# Patient Record
Sex: Female | Born: 1955 | Race: White | Hispanic: No | Smoking: Current every day smoker
Health system: Southern US, Community
[De-identification: ages and names within clinical notes are randomized; demographics above are authoritative.]

---

## 1998-01-29 ENCOUNTER — Ambulatory Visit (HOSPITAL_COMMUNITY): Admission: RE | Admit: 1998-01-29 | Discharge: 1998-01-29 | Payer: Self-pay | Admitting: Obstetrics and Gynecology

## 1998-05-20 ENCOUNTER — Other Ambulatory Visit: Admission: RE | Admit: 1998-05-20 | Discharge: 1998-05-20 | Payer: Self-pay | Admitting: Obstetrics and Gynecology

## 1998-08-19 ENCOUNTER — Other Ambulatory Visit: Admission: RE | Admit: 1998-08-19 | Discharge: 1998-08-19 | Payer: Self-pay | Admitting: Obstetrics and Gynecology

## 1998-11-11 ENCOUNTER — Encounter: Payer: Self-pay | Admitting: Obstetrics and Gynecology

## 1998-11-11 ENCOUNTER — Ambulatory Visit (HOSPITAL_COMMUNITY): Admission: RE | Admit: 1998-11-11 | Discharge: 1998-11-11 | Payer: Self-pay | Admitting: Obstetrics and Gynecology

## 1998-11-16 ENCOUNTER — Other Ambulatory Visit: Admission: RE | Admit: 1998-11-16 | Discharge: 1998-11-16 | Payer: Self-pay | Admitting: Obstetrics and Gynecology

## 2004-08-11 ENCOUNTER — Other Ambulatory Visit: Admission: RE | Admit: 2004-08-11 | Discharge: 2004-08-11 | Payer: Self-pay | Admitting: *Deleted

## 2008-07-30 ENCOUNTER — Other Ambulatory Visit: Admission: RE | Admit: 2008-07-30 | Discharge: 2008-07-30 | Payer: Self-pay | Admitting: Family Medicine

## 2010-12-19 ENCOUNTER — Encounter: Payer: Self-pay | Admitting: Gastroenterology

## 2012-02-19 ENCOUNTER — Ambulatory Visit (INDEPENDENT_AMBULATORY_CARE_PROVIDER_SITE_OTHER): Payer: BC Managed Care – PPO | Admitting: Obstetrics and Gynecology

## 2012-02-19 DIAGNOSIS — N898 Other specified noninflammatory disorders of vagina: Secondary | ICD-10-CM

## 2012-02-19 DIAGNOSIS — Z01419 Encounter for gynecological examination (general) (routine) without abnormal findings: Secondary | ICD-10-CM

## 2012-02-19 DIAGNOSIS — R319 Hematuria, unspecified: Secondary | ICD-10-CM

## 2012-03-18 ENCOUNTER — Encounter: Payer: Self-pay | Admitting: Obstetrics and Gynecology

## 2012-03-18 ENCOUNTER — Ambulatory Visit (INDEPENDENT_AMBULATORY_CARE_PROVIDER_SITE_OTHER): Payer: BC Managed Care – PPO | Admitting: Obstetrics and Gynecology

## 2012-03-18 DIAGNOSIS — N898 Other specified noninflammatory disorders of vagina: Secondary | ICD-10-CM

## 2012-03-18 DIAGNOSIS — F419 Anxiety disorder, unspecified: Secondary | ICD-10-CM

## 2012-03-18 DIAGNOSIS — F411 Generalized anxiety disorder: Secondary | ICD-10-CM

## 2012-03-18 DIAGNOSIS — N951 Menopausal and female climacteric states: Secondary | ICD-10-CM | POA: Insufficient documentation

## 2012-03-18 DIAGNOSIS — R319 Hematuria, unspecified: Secondary | ICD-10-CM

## 2012-03-18 DIAGNOSIS — N390 Urinary tract infection, site not specified: Secondary | ICD-10-CM | POA: Insufficient documentation

## 2012-03-18 LAB — POCT URINALYSIS DIPSTICK
Bilirubin, UA: NEGATIVE
Blood, UA: NEGATIVE
Glucose, UA: NEGATIVE
Ketones, UA: NEGATIVE
Leukocytes, UA: NEGATIVE
Nitrite, UA: NEGATIVE
Protein, UA: NEGATIVE
Spec Grav, UA: 1.025
Urobilinogen, UA: NEGATIVE
pH, UA: 5

## 2012-03-18 NOTE — Progress Notes (Addendum)
Pt reports that she is better. Pos urine culture treated with Septra. Vag disch resolved with Flagyl. Anxiety/vertigo is better(?). Hx of hematuria.  UA: neg  Plan: RTO prn or for annual.  AVS

## 2016-02-19 ENCOUNTER — Ambulatory Visit (INDEPENDENT_AMBULATORY_CARE_PROVIDER_SITE_OTHER): Payer: BLUE CROSS/BLUE SHIELD | Admitting: Family Medicine

## 2016-02-19 VITALS — BP 126/84 | HR 99 | Temp 99.5°F | Resp 18 | Ht 61.0 in | Wt 119.2 lb

## 2016-02-19 DIAGNOSIS — R319 Hematuria, unspecified: Secondary | ICD-10-CM | POA: Diagnosis not present

## 2016-02-19 DIAGNOSIS — F329 Major depressive disorder, single episode, unspecified: Secondary | ICD-10-CM

## 2016-02-19 DIAGNOSIS — R300 Dysuria: Secondary | ICD-10-CM | POA: Diagnosis not present

## 2016-02-19 DIAGNOSIS — J0100 Acute maxillary sinusitis, unspecified: Secondary | ICD-10-CM

## 2016-02-19 DIAGNOSIS — R51 Headache: Secondary | ICD-10-CM

## 2016-02-19 DIAGNOSIS — F32A Depression, unspecified: Secondary | ICD-10-CM

## 2016-02-19 DIAGNOSIS — R519 Headache, unspecified: Secondary | ICD-10-CM

## 2016-02-19 LAB — POCT URINALYSIS DIP (MANUAL ENTRY)
Bilirubin, UA: NEGATIVE
Glucose, UA: NEGATIVE
Leukocytes, UA: NEGATIVE
Nitrite, UA: NEGATIVE
Protein Ur, POC: NEGATIVE
Spec Grav, UA: <=1.005
Urobilinogen, UA: 0.2
pH, UA: 6

## 2016-02-19 LAB — POC MICROSCOPIC URINALYSIS (UMFC): Mucus: ABSENT

## 2016-02-19 MED ORDER — BUPROPION HCL ER (XL) 150 MG PO TB24: 150.0000 mg | ORAL_TABLET | Freq: Every day | ORAL | Status: DC

## 2016-02-19 MED ORDER — CIPROFLOXACIN HCL 500 MG PO TABS: 500.0000 mg | ORAL_TABLET | Freq: Two times a day (BID) | ORAL | Status: DC

## 2016-02-19 NOTE — Patient Instructions (Addendum)
Sinusitis, Adult Sinusitis is redness, soreness, and inflammation of the paranasal sinuses. Paranasal sinuses are air pockets within the bones of your face. They are located beneath your eyes, in the middle of your forehead, and above your eyes. In healthy paranasal sinuses, mucus is able to drain out, and air is able to circulate through them by way of your nose. However, when your paranasal sinuses are inflamed, mucus and air can become trapped. This can allow bacteria and other germs to grow and cause infection. Sinusitis can develop quickly and last only a short time (acute) or continue over a long period (chronic). Sinusitis that lasts for more than 12 weeks is considered chronic. CAUSES Causes of sinusitis include:  Allergies.  Structural abnormalities, such as displacement of the cartilage that separates your nostrils (deviated septum), which can decrease the air flow through your nose and sinuses and affect sinus drainage.  Functional abnormalities, such as when the small hairs (cilia) that line your sinuses and help remove mucus do not work properly or are not present. SIGNS AND SYMPTOMS Symptoms of acute and chronic sinusitis are the same. The primary symptoms are pain and pressure around the affected sinuses. Other symptoms include:  Upper toothache.  Earache.  Headache.  Bad breath.  Decreased sense of smell and taste.  A cough, which worsens when you are lying flat.  Fatigue.  Fever.  Thick drainage from your nose, which often is green and may contain pus (purulent).  Swelling and warmth over the affected sinuses. DIAGNOSIS Your health care provider will perform a physical exam. During your exam, your health care provider may perform any of the following to help determine if you have acute sinusitis or chronic sinusitis:  Look in your nose for signs of abnormal growths in your nostrils (nasal polyps).  Tap over the affected sinus to check for signs of  infection.  View the inside of your sinuses using an imaging device that has a light attached (endoscope). If your health care provider suspects that you have chronic sinusitis, one or more of the following tests may be recommended:  Allergy tests.  Nasal culture. A sample of mucus is taken from your nose, sent to a lab, and screened for bacteria.  Nasal cytology. A sample of mucus is taken from your nose and examined by your health care provider to determine if your sinusitis is related to an allergy. TREATMENT Most cases of acute sinusitis are related to a viral infection and will resolve on their own within 10 days. Sometimes, medicines are prescribed to help relieve symptoms of both acute and chronic sinusitis. These may include pain medicines, decongestants, nasal steroid sprays, or saline sprays. However, for sinusitis related to a bacterial infection, your health care provider will prescribe antibiotic medicines. These are medicines that will help kill the bacteria causing the infection. Rarely, sinusitis is caused by a fungal infection. In these cases, your health care provider will prescribe antifungal medicine. For some cases of chronic sinusitis, surgery is needed. Generally, these are cases in which sinusitis recurs more than 3 times per year, despite other treatments. HOME CARE INSTRUCTIONS  Drink plenty of water. Water helps thin the mucus so your sinuses can drain more easily.  Use a humidifier.  Inhale steam 3-4 times a day (for example, sit in the bathroom with the shower running).  Apply a warm, moist washcloth to your face 3-4 times a day, or as directed by your health care provider.  Use saline nasal sprays to help   moisten and clean your sinuses.  Take medicines only as directed by your health care provider.  If you were prescribed either an antibiotic or antifungal medicine, finish it all even if you start to feel better. SEEK IMMEDIATE MEDICAL CARE IF:  You have  increasing pain or severe headaches.  You have nausea, vomiting, or drowsiness.  You have swelling around your face.  You have vision problems.  You have a stiff neck.  You have difficulty breathing.   This information is not intended to replace advice given to you by your health care provider. Make sure you discuss any questions you have with your health care provider.   Document Released: 11/13/2005 Document Revised: 12/04/2014 Document Reviewed: 11/28/2011 Elsevier Interactive Patient Education 2016 Elsevier Inc.  Urinary Tract Infection Urinary tract infections (UTIs) can develop anywhere along your urinary tract. Your urinary tract is your body's drainage system for removing wastes and extra water. Your urinary tract includes two kidneys, two ureters, a bladder, and a urethra. Your kidneys are a pair of bean-shaped organs. Each kidney is about the size of your fist. They are located below your ribs, one on each side of your spine. CAUSES Infections are caused by microbes, which are microscopic organisms, including fungi, viruses, and bacteria. These organisms are so small that they can only be seen through a microscope. Bacteria are the microbes that most commonly cause UTIs. SYMPTOMS  Symptoms of UTIs may vary by age and gender of the patient and by the location of the infection. Symptoms in young women typically include a frequent and intense urge to urinate and a painful, burning feeling in the bladder or urethra during urination. Older women and men are more likely to be tired, shaky, and weak and have muscle aches and abdominal pain. A fever may mean the infection is in your kidneys. Other symptoms of a kidney infection include pain in your back or sides below the ribs, nausea, and vomiting. DIAGNOSIS To diagnose a UTI, your caregiver will ask you about your symptoms. Your caregiver will also ask you to provide a urine sample. The urine sample will be tested for bacteria and white  blood cells. White blood cells are made by your body to help fight infection. TREATMENT  Typically, UTIs can be treated with medication. Because most UTIs are caused by a bacterial infection, they usually can be treated with the use of antibiotics. The choice of antibiotic and length of treatment depend on your symptoms and the type of bacteria causing your infection. HOME CARE INSTRUCTIONS  If you were prescribed antibiotics, take them exactly as your caregiver instructs you. Finish the medication even if you feel better after you have only taken some of the medication.  Drink enough water and fluids to keep your urine clear or pale yellow.  Avoid caffeine, tea, and carbonated beverages. They tend to irritate your bladder.  Empty your bladder often. Avoid holding urine for long periods of time.  Empty your bladder before and after sexual intercourse.  After a bowel movement, women should cleanse from front to back. Use each tissue only once. SEEK MEDICAL CARE IF:   You have back pain.  You develop a fever.  Your symptoms do not begin to resolve within 3 days. SEEK IMMEDIATE MEDICAL CARE IF:   You have severe back pain or lower abdominal pain.  You develop chills.  You have nausea or vomiting.  You have continued burning or discomfort with urination. MAKE SURE YOU:   Understand these  instructions.  Will watch your condition.  Will get help right away if you are not doing well or get worse.   This information is not intended to replace advice given to you by your health care provider. Make sure you discuss any questions you have with your health care provider.   Document Released: 08/23/2005 Document Revised: 08/04/2015 Document Reviewed: 12/22/2011 Elsevier Interactive Patient Education [employer]2016 Elsevier Inc.

## 2016-02-19 NOTE — Progress Notes (Signed)
Subjective:    Patient ID: Kari CorningSheila W Savage, female    DOB: 03/13/1956, 60 y.o.   MRN: [id number] By signing my name below, I, Soijett Blue, attest that this documentation has been prepared under the direction and in the presence of Elvina SidleKurt Yaxiel Minnie, MD. Electronically Signed: Soijett Blue, ED Scribe. 02/19/2016. 4:10 PM.  Chief Complaint  Patient presents with  . Headache    for the past few days  . Depression    Due to situation at home, scored a 6 on screening     HPI   Kari Savage is a 60 y.o. female who presents today complaining of constant, moderate, HA onset 2 days. Pt is having associated symptoms of photophobia. She notes that she has not tried any medications for the relief of her symptoms. She denies any other symptoms.   Pt secondarily complains of malodorous urine x 1 week and dysuria. Pt has not tried any medications for the relief of her symptoms. Pt denies any other symptoms.     Pt works at [employer].   History reviewed. No pertinent past medical history.  Allergies  Allergen Reactions  . Penicillins Rash   Current Outpatient Prescriptions on File Prior to Visit  Medication Sig Dispense Refill  . Multiple Vitamin (MULTIVITAMIN) capsule Take 1 capsule by mouth daily.     No current facility-administered medications on file prior to visit.   Patient has been experiencing "dark thoughts" for the last several weeks. She's been on Wellbutrin in the past and it has worked well for her. She would like a refill   Review of Systems  Eyes: Positive for photophobia.  Genitourinary: Positive for dysuria.       Malodorous urine  Neurological: Positive for headaches.  All other systems reviewed and are negative.      Objective:   Physical Exam  Constitutional: She is oriented to person, place, and time. She appears well-developed and well-nourished. No distress.  HENT:  Head: Normocephalic and atraumatic.  Right Ear: Tympanic  membrane, external ear and ear canal normal.  Left Ear: Tympanic membrane, external ear and ear canal normal.  Mouth/Throat: Uvula is midline, oropharynx is clear and moist and mucous membranes are normal.  Mild swelling of nasal passages.   Eyes: EOM are normal.  Neck: Neck supple.  Cardiovascular: Normal rate, regular rhythm and normal heart sounds.   No murmur heard. Pulmonary/Chest: Effort normal and breath sounds normal. No respiratory distress. She has no wheezes. She has no rales.  Abdominal: There is no CVA tenderness.  Musculoskeletal: Normal range of motion.  Neurological: She is alert and oriented to person, place, and time.  Skin: Skin is warm and dry.  Psychiatric: She has a normal mood and affect. Her behavior is normal.  Nursing note and vitals reviewed.        BP 126/84 mmHg  Pulse 99  Temp(Src) 99.5 F (37.5 C) (Oral)  Resp 18  Ht 5\' 1"  (1.549 m)  Wt 119 lb 3.2 oz (54.069 kg)  BMI 22.53 kg/m2  SpO2 97%  Assessment & Plan:   This chart was scribed in my presence and reviewed by me personally.    ICD-9-CM ICD-10-CM   1. Dysuria 788.1 R30.0 POCT urinalysis dipstick     POCT Microscopic Urinalysis ([facility])     Urine culture  2. Sinus headache 784.0 R51   3. Acute maxillary sinusitis, recurrence not specified 461.0 J01.00   4. Hematuria 599.70 R31.9 ciprofloxacin (CIPRO) 500 MG  tablet     Urine culture  5. Depression 311 F32.9 buPROPion (WELLBUTRIN XL) 150 MG 24 hr tablet     Signed, Elvina Sidle, MD

## 2016-02-21 LAB — URINE CULTURE
Colony Count: NO GROWTH
Organism ID, Bacteria: NO GROWTH

## 2016-08-14 ENCOUNTER — Other Ambulatory Visit: Payer: Self-pay | Admitting: Family Medicine

## 2016-08-14 DIAGNOSIS — F329 Major depressive disorder, single episode, unspecified: Secondary | ICD-10-CM

## 2016-08-14 DIAGNOSIS — F32A Depression, unspecified: Secondary | ICD-10-CM

## 2016-09-12 ENCOUNTER — Other Ambulatory Visit: Payer: Self-pay | Admitting: Family Medicine

## 2016-09-12 DIAGNOSIS — F329 Major depressive disorder, single episode, unspecified: Secondary | ICD-10-CM

## 2016-09-12 DIAGNOSIS — F32A Depression, unspecified: Secondary | ICD-10-CM

## 2016-09-28 ENCOUNTER — Ambulatory Visit (INDEPENDENT_AMBULATORY_CARE_PROVIDER_SITE_OTHER): Payer: BLUE CROSS/BLUE SHIELD | Admitting: Family Medicine

## 2016-09-28 VITALS — BP 140/82 | HR 97 | Temp 98.7°F | Resp 17 | Ht 61.0 in | Wt 127.0 lb

## 2016-09-28 DIAGNOSIS — F419 Anxiety disorder, unspecified: Secondary | ICD-10-CM

## 2016-09-28 MED ORDER — BUPROPION HCL ER (XL) 150 MG PO TB24: ORAL_TABLET | ORAL | 3 refills | Status: DC

## 2016-09-28 NOTE — Patient Instructions (Addendum)
Return for follow-up in 6 months.  Your prescription has been refilled and submitted to your pharmacy.  Nice meeting you !  Godfrey PickKimberly S. Tiburcio PeaHarris, MSN, FNP-C [facility]   IF you received an x-ray today, you will receive an invoice from [facility]. Please contact [facility] at [phone] with questions or concerns regarding your invoice.   IF you received labwork today, you will receive an invoice from [facility]. Please contact [facility] at [phone] with questions or concerns regarding your invoice.   Our billing staff will not be able to assist you with questions regarding bills from these companies.  You will be contacted with the lab results as soon as they are available. The fastest way to get your results is to activate your My Chart account. Instructions are located on the last page of this paperwork. If you have not heard from us regarding the results in 2 weeks, please contact this office.   Smoking Cessation, Tips for Success If you are ready to quit smoking, congratulations! You have chosen to help yourself be healthier. Cigarettes bring nicotine, tar, carbon monoxide, and other irritants into your body. Your lungs, heart, and blood vessels will be able to work better without these poisons. There are many different ways to quit smoking. Nicotine gum, nicotine patches, a nicotine inhaler, or nicotine nasal spray can help with physical craving. Hypnosis, support groups, and medicines help break the habit of smoking. WHAT THINGS CAN I DO TO MAKE QUITTING EASIER?  Here are some tips to help you quit for good:  Pick a date when you will quit smoking completely. Tell all of your friends and family about your plan to quit on that date.  Do not try to slowly cut down on the number of cigarettes you are smoking. Pick a quit date and quit smoking completely starting on that day.  Throw away all  cigarettes.   Clean and remove all ashtrays from your home, work, and car.  On a card, write down your reasons for quitting. Carry the card with you and read it when you get the urge to smoke.  Cleanse your body of nicotine. Drink enough water and fluids to keep your urine clear or pale yellow. Do this after quitting to flush the nicotine from your body.  Learn to predict your moods. Do not let a bad situation be your excuse to have a cigarette. Some situations in your life might tempt you into wanting a cigarette.  Never have "just one" cigarette. It leads to wanting another and another. Remind yourself of your decision to quit.  Change habits associated with smoking. If you smoked while driving or when feeling stressed, try other activities to replace smoking. Stand up when drinking your coffee. Brush your teeth after eating. Sit in a different chair when you read the paper. Avoid alcohol while trying to quit, and try to drink fewer caffeinated beverages. Alcohol and caffeine may urge you to smoke.  Avoid foods and drinks that can trigger a desire to smoke, such as sugary or spicy foods and alcohol.  Ask people who smoke not to smoke around you.  Have something planned to do right after eating or having a cup of coffee. For example, plan to take a walk or exercise.  Try a relaxation exercise to calm you down and decrease your stress. Remember, you may be tense and nervous for the first 2 weeks after you quit, but this  will pass.  Find new activities to keep your hands busy. Play with a pen, coin, or rubber band. Doodle or draw things on paper.  Brush your teeth right after eating. This will help cut down on the craving for the taste of tobacco after meals. You can also try mouthwash.   Use oral substitutes in place of cigarettes. Try using lemon drops, carrots, cinnamon sticks, or chewing gum. Keep them handy so they are available when you have the urge to smoke.  When you have the  urge to smoke, try deep breathing.  Designate your home as a nonsmoking area.  If you are a heavy smoker, ask your health care provider about a prescription for nicotine chewing gum. It can ease your withdrawal from nicotine.  Reward yourself. Set aside the cigarette money you save and buy yourself something nice.  Look for support from others. Join a support group or smoking cessation program. Ask someone at home or at work to help you with your plan to quit smoking.  Always ask yourself, "Do I need this cigarette or is this just a reflex?" Tell yourself, "Today, I choose not to smoke," or "I do not want to smoke." You are reminding yourself of your decision to quit.  Do not replace cigarette smoking with electronic cigarettes (commonly called e-cigarettes). The safety of e-cigarettes is unknown, and some may contain harmful chemicals.  If you relapse, do not give up! Plan ahead and think about what you will do the next time you get the urge to smoke. HOW WILL I FEEL WHEN I QUIT SMOKING? You may have symptoms of withdrawal because your body is used to nicotine (the addictive substance in cigarettes). You may crave cigarettes, be irritable, feel very hungry, cough often, get headaches, or have difficulty concentrating. The withdrawal symptoms are only temporary. They are strongest when you first quit but will go away within 10-14 days. When withdrawal symptoms occur, stay in control. Think about your reasons for quitting. Remind yourself that these are signs that your body is healing and getting used to being without cigarettes. Remember that withdrawal symptoms are easier to treat than the major diseases that smoking can cause.  Even after the withdrawal is over, expect periodic urges to smoke. However, these cravings are generally short lived and will go away whether you smoke or not. Do not smoke! WHAT RESOURCES ARE AVAILABLE TO HELP ME QUIT SMOKING? Your health care provider can direct you to  community resources or hospitals for support, which may include:  Group support.  Education.  Hypnosis.  Therapy.   This information is not intended to replace advice given to you by your health care provider. Make sure you discuss any questions you have with your health care provider.   Document Released: 08/11/2004 Document Revised: 12/04/2014 Document Reviewed: 05/01/2013 Elsevier Interactive Patient Education [employer]2016 Elsevier Inc.

## 2016-09-28 NOTE — Progress Notes (Signed)
Subjective:     Kari CorningSheila W Savage is a 60 y.o. female who presents for follow up of anxiety disorder. Currently asymptomatic  She denies current suicidal and homicidal ideation. Denies any side effects from the treatment. Reports that panic attacks have completely resolved since she's started the medication and she has significantly reduced the amount of cigarettes she smokes daily.Denies any unintentional weight loss.    Objective:    BP 140/82 (BP Location: Left Arm, Patient Position: Sitting, Cuff Size: Normal)   Pulse 97   Temp 98.7 F (37.1 C) (Oral)   Resp 17   Ht 5\' 1"  (1.549 m)   Wt 127 lb (57.6 kg)   SpO2 98%   BMI 24.00 kg/m   General:  alert, cooperative and appears stated age  Affect/Behavior:  full facial expressions, good grooming, good insight, normal perception and normal reasoning very plesant and cheerful disposition.  Normal heart rate and rhythm.  Breath sounds within normal limits.      Assessment:    Anxiety Disorder - improving, panic attacks have completely resolved since taking Wellbutrin.     Plan:    Medications: Continue Wellbutrin 150 mg XR.   Return for follow-up in 6 months.  Kari PickKimberly S. Tiburcio PeaHarris, MSN, FNP-C [facility]  Total of 15 minutes  spent with pt., greater than 50% which was spent in discussion of treatment effectiveness and symptom management of anxiety.

## 2016-09-29 ENCOUNTER — Ambulatory Visit: Payer: BLUE CROSS/BLUE SHIELD

## 2017-11-28 ENCOUNTER — Other Ambulatory Visit: Payer: Self-pay

## 2017-11-28 ENCOUNTER — Ambulatory Visit: Payer: BLUE CROSS/BLUE SHIELD | Admitting: Physician Assistant

## 2017-11-28 ENCOUNTER — Encounter: Payer: Self-pay | Admitting: Physician Assistant

## 2017-11-28 VITALS — BP 157/91 | HR 85 | Temp 98.6°F | Resp 16 | Ht 61.0 in | Wt 132.8 lb

## 2017-11-28 DIAGNOSIS — Z8659 Personal history of other mental and behavioral disorders: Secondary | ICD-10-CM

## 2017-11-28 MED ORDER — BUPROPION HCL ER (XL) 150 MG PO TB24: ORAL_TABLET | ORAL | 3 refills | Status: AC

## 2017-11-28 NOTE — Progress Notes (Signed)
   Kari CorningSheila W Savage  MRN: [id number] DOB: 08/29/1956  PCP: Kirkland HunStringer, Arthur, MD  Subjective:  Pt is a 62 year old female who presents to clinic for medication refill of Wellbutrin XL 150mg  qd. Last OV for this 09/2016 seen by Joaquin CourtsKimberly Harris. Wellbutrin started by Dr. Milus GlazierLauenstein 01/2016 for panic attacks.  Had attacks daily -has not had a panic attack since she started this medication. Attacks are brought on by home stressors. She is living with her ex husband who does not have a job and she is paying all the bills. He has a job [employer] tomorrow. She had a suicide attempt on 08/12/2015 - tried to hang herself. Her roommate came in and saved her.  She was in in-patient behavioral health facility for one week. Denies SI or HI since that time.   Several care gaps including PAP, colonoscopy, MM. She has had bad experiences with medical providers in the past and is hesitant to make annual exam appt.   Review of Systems  Gastrointestinal: Negative for abdominal pain, nausea and vomiting.  Psychiatric/Behavioral: Negative for self-injury and suicidal ideas. The patient is nervous/anxious.     Patient Active Problem List   Diagnosis Date Noted  . UTI (lower urinary tract infection) 03/18/2012  . Vaginal discharge 03/18/2012  . Symptomatic menopausal or female climacteric states 03/18/2012  . Anxiety 03/18/2012    Current Outpatient Medications on File Prior to Visit  Medication Sig Dispense Refill  . buPROPion (WELLBUTRIN XL) 150 MG 24 hr tablet TAKE 1 TABLET(150 MG) BY MOUTH DAILY 90 tablet 3  . Multiple Vitamin (MULTIVITAMIN) capsule Take 1 capsule by mouth daily.     No current facility-administered medications on file prior to visit.     Allergies  Allergen Reactions  . Penicillins Rash     Objective:  BP (!) 157/91   Pulse 85   Temp 98.6 F (37 C) (Oral)   Resp 16   Ht 5\' 1"  (1.549 m)   Wt 132 lb 12.8 oz (60.2 kg)   SpO2 97%   BMI 25.09 kg/m   Physical Exam    Constitutional: She is oriented to person, place, and time and well-developed, well-nourished, and in no distress. No distress.  Cardiovascular: Normal rate, regular rhythm and normal heart sounds.  Neurological: She is alert and oriented to person, place, and time. GCS score is 15.  Skin: Skin is warm and dry.  Psychiatric: Mood, memory, affect and judgment normal.  Vitals reviewed.   Assessment and Plan :  1. History of panic attacks - buPROPion (WELLBUTRIN XL) 150 MG 24 hr tablet; TAKE 1 TABLET(150 MG) BY MOUTH DAILY  Dispense: 90 tablet; Refill: 3 - Wellbutrin x 2 years. Panic attacks well controlled with 150mg  qd. H/o suicide attempt 3 years ago. She denies SI or HI. Denies medication side effects. OK to refill. Encouraged pt to RTC for annual exam. MM phone number provided.   Marco CollieWhitney Euline Kimbler, PA-C  [facility] 11/28/2017 12:20 PM

## 2017-11-28 NOTE — Patient Instructions (Addendum)
Make an appointment for your annual exam. You are due for a PAP and colonoscopy.   You need to schedule you mammogram.  Please call the breast center at [facility] at [phone] to schedule   Thank you for coming in today. I hope you feel we met your needs.  Feel free to call PCP if you have any questions or further requests.  Please consider signing up for MyChart if you do not already have it, as this is a great way to communicate with me.  Best,  Whitney McVey, PA-C   IF you received an x-ray today, you will receive an invoice from [facility]. Please contact [facility] at [phone] with questions or concerns regarding your invoice.   IF you received labwork today, you will receive an invoice from [facility]. Please contact [facility] at [phone] with questions or concerns regarding your invoice.   Our billing staff will not be able to assist you with questions regarding bills from these companies.  You will be contacted with the lab results as soon as they are available. The fastest way to get your results is to activate your My Chart account. Instructions are located on the last page of this paperwork. If you have not heard from [place] regarding the results in 2 weeks, please contact this office.

## 2018-02-04 ENCOUNTER — Other Ambulatory Visit: Payer: Self-pay

## 2018-02-04 ENCOUNTER — Ambulatory Visit (INDEPENDENT_AMBULATORY_CARE_PROVIDER_SITE_OTHER): Payer: BLUE CROSS/BLUE SHIELD | Admitting: Physician Assistant

## 2018-02-04 ENCOUNTER — Encounter: Payer: Self-pay | Admitting: Physician Assistant

## 2018-02-04 VITALS — BP 130/76 | HR 98 | Temp 98.3°F | Resp 16 | Ht 60.0 in | Wt 129.6 lb

## 2018-02-04 DIAGNOSIS — H6123 Impacted cerumen, bilateral: Secondary | ICD-10-CM | POA: Diagnosis not present

## 2018-02-04 DIAGNOSIS — H938X2 Other specified disorders of left ear: Secondary | ICD-10-CM

## 2018-02-04 NOTE — Patient Instructions (Signed)
     IF you received an x-ray today, you will receive an invoice from [facility]. Please contact [facility] at [phone] with questions or concerns regarding your invoice.   IF you received labwork today, you will receive an invoice from [facility]. Please contact [facility] at [phone] with questions or concerns regarding your invoice.   Our billing staff will not be able to assist you with questions regarding bills from these companies.  You will be contacted with the lab results as soon as they are available. The fastest way to get your results is to activate your My Chart account. Instructions are located on the last page of this paperwork. If you have not heard from us regarding the results in 2 weeks, please contact this office.     

## 2018-02-04 NOTE — Progress Notes (Signed)
   Kari CorningSheila W Savage  MRN: [id number] DOB: 04/16/1956  PCP: Nonnie DoneSlatosky, John J., MD  Subjective:  Pt is a 62 year old female who presents to clinic for left ear pain. Ear has been bothersome for about one month and has worsened over the past few days. Endorses decreased hearing on left. Left-sided HA. She has used a hot washcloth and placed it on her left ear. This did not help much.  She has had this problem in the past. Has had to get her ears cleaned out before. Last ear lavage was several years ago.   Review of Systems  Constitutional: Negative for chills, diaphoresis, fatigue and fever.  HENT: Positive for ear pain and hearing loss. Negative for congestion, ear discharge, postnasal drip, rhinorrhea, sinus pressure, sinus pain, sore throat and tinnitus.   Respiratory: Negative for cough.   Neurological: Positive for headaches. Negative for dizziness.  Psychiatric/Behavioral: Negative for sleep disturbance.    Patient Active Problem List   Diagnosis Date Noted  . UTI (lower urinary tract infection) 03/18/2012  . Vaginal discharge 03/18/2012  . Symptomatic menopausal or female climacteric states 03/18/2012  . Anxiety 03/18/2012    Current Outpatient Medications on File Prior to Visit  Medication Sig Dispense Refill  . buPROPion (WELLBUTRIN XL) 150 MG 24 hr tablet TAKE 1 TABLET(150 MG) BY MOUTH DAILY 90 tablet 3  . Multiple Vitamin (MULTIVITAMIN) capsule Take 1 capsule by mouth daily.     No current facility-administered medications on file prior to visit.     Allergies  Allergen Reactions  . Penicillins Rash     Objective:  BP 130/76   Pulse 98   Temp 98.3 F (36.8 C) (Oral)   Resp 16   Ht 5' (1.524 m)   Wt 129 lb 9.6 oz (58.8 kg)   SpO2 96%   BMI 25.31 kg/m   Physical Exam  Constitutional: She is oriented to person, place, and time and well-developed, well-nourished, and in no distress. No distress.  HENT:  Right Ear: Tympanic membrane normal.  Left Ear: Tympanic  membrane normal.  Nose: Mucosal edema present. No rhinorrhea. Right sinus exhibits no maxillary sinus tenderness and no frontal sinus tenderness. Left sinus exhibits no maxillary sinus tenderness and no frontal sinus tenderness.  Mouth/Throat: Oropharynx is clear and moist and mucous membranes are normal.  B/l cerumen impaction.   Neurological: She is alert and oriented to person, place, and time. GCS score is 15.  Skin: Skin is warm and dry.  Psychiatric: Mood, memory, affect and judgment normal.  Vitals reviewed.   Assessment and Plan :  1. Bilateral impacted cerumen 2. Pressure sensation in left ear - Ear wax removal - Ambulatory referral to ENT - We had a very difficulty time with today's ear lavage due to the anatomy of pt's ear canals. I feel she would benefit from eval and treatment by ENT. Will refer. She understands and agrees.    Marco CollieWhitney Riven Beebe, PA-C  [facility] 02/04/2018 2:40 PM

## 2018-02-27 ENCOUNTER — Other Ambulatory Visit: Payer: Self-pay

## 2018-02-27 ENCOUNTER — Ambulatory Visit: Payer: BLUE CROSS/BLUE SHIELD | Admitting: Emergency Medicine

## 2018-02-27 ENCOUNTER — Ambulatory Visit (INDEPENDENT_AMBULATORY_CARE_PROVIDER_SITE_OTHER): Payer: BLUE CROSS/BLUE SHIELD

## 2018-02-27 ENCOUNTER — Encounter: Payer: Self-pay | Admitting: Emergency Medicine

## 2018-02-27 VITALS — BP 170/76 | HR 100 | Temp 98.7°F | Resp 16 | Ht 61.0 in | Wt 131.8 lb

## 2018-02-27 DIAGNOSIS — S63601A Unspecified sprain of right thumb, initial encounter: Secondary | ICD-10-CM | POA: Diagnosis not present

## 2018-02-27 DIAGNOSIS — S6991XA Unspecified injury of right wrist, hand and finger(s), initial encounter: Secondary | ICD-10-CM | POA: Diagnosis not present

## 2018-02-27 NOTE — Patient Instructions (Addendum)
IF you received an x-ray today, you will receive an invoice from [facility]. Please contact [facility] at [phone] with questions or concerns regarding your invoice.   IF you received labwork today, you will receive an invoice from [facility]. Please contact [facility] at [phone] with questions or concerns regarding your invoice.   Our billing staff will not be able to assist you with questions regarding bills from these companies.  You will be contacted with the lab results as soon as they are available. The fastest way to get your results is to activate your My Chart account. Instructions are located on the last page of this paperwork. If you have not heard from [place] regarding the results in 2 weeks, please contact this office.    Thumb Sprain A thumb sprain is an injury to one of the strong bands of tissue (ligaments) that connect the bones in your thumb. The ligament can be stretched too much or it can tear. A tear can be either partial or complete. The severity of the sprain depends on how much of the ligament was damaged or torn. What are the causes? A thumb sprain is often caused by a fall or an accident. If you extend your hands to catch an object or to protect yourself, the force of the impact can cause your ligament to stretch too much. This excess tension can also cause your ligament to tear. What increases the risk? This injury is more likely to occur in people who play:  Sports that involve a greater risk of falling, such as skiing.  Sports that involve catching an object, such as basketball.  What are the signs or symptoms? Symptoms of this condition include:  Loss of motion in your thumb.  Bruising.  Tenderness.  Swelling.  How is this diagnosed? This condition is diagnosed with a medical history and physical exam. You may also have an X-ray of your thumb. How is this treated? Treatment varies depending on the severity of your sprain. If  your ligament is overstretched or partially torn, treatment usually involves keeping your thumb in a fixed position (immobilization) for a period of time. To help you do this, your health care provider will apply a bandage, cast, or splint to keep your thumb from moving until it heals. If your ligament is fully torn, you may need surgery to reconnect the ligament to the bone. After surgery, a cast or splint will be applied and will need to stay on your thumb while it heals. Your health care provider may also suggest exercises or physical therapy to strengthen your thumb. Follow these instructions at home: If you have a cast:  Do not stick anything inside the cast to scratch your skin. Doing that increases your risk of infection.  Check the skin around the cast every day. Report any concerns to your health care provider. You may put lotion on dry skin around the edges of the cast. Do not apply lotion to the skin underneath the cast.  Keep the cast clean and dry. If you have a splint:  Wear it as directed by your health care provider. Remove it only as directed by your health care provider.  Loosen the splint if your fingers become numb and tingle, or if they turn cold and blue.  Keep the splint clean and dry. Bathing  Cover the bandage, cast, or splint with a watertight plastic bag to protect it from water while you take a bath or a shower. Do  not let the bandage, cast, or splint get wet. Managing pain, stiffness, and swelling  If directed, apply ice to the injured area (unless you have a cast): ? Put ice in a plastic bag. ? Place a towel between your skin and the bag. ? Leave the ice on for 20 minutes, 2-3 times per day.  Move your fingers often to avoid stiffness and to lessen swelling.  Raise (elevate) the injured area above the level of your heart while you are sitting or lying down. Driving  Do not drive or operate heavy machinery while taking pain medicine.  Do not drive while  wearing a cast or splint on a hand that you use for driving. General instructions  Do not put pressure on any part of your cast or splint until it is fully hardened. This may take several hours.  Take medicines only as directed by your health care provider. These include over-the-counter medicines and prescription medicines.  Keep all follow-up visits as directed by your health care provider. This is important.  Do any exercise or physical therapy as directed by your health care provider.  Do not wear rings on your injured thumb. Contact a health care provider if:  Your pain is not controlled with medicine.  Your bruising or swelling gets worse.  Your cast or splint is damaged. Get help right away if:  Your thumb is numb or blue.  Your thumb feels colder than normal. This information is not intended to replace advice given to you by your health care provider. Make sure you discuss any questions you have with your health care provider. Document Released: 12/21/2004 Document Revised: 07/16/2016 Document Reviewed: 08/25/2014 Elsevier Interactive Patient Education  [employer]2018 Elsevier Inc.

## 2018-02-27 NOTE — Progress Notes (Signed)
Kari Savage 62 y.o.   Chief Complaint  Patient presents with  . Hand Injury    RIGHT thumb x 2 day with swelling    HISTORY OF PRESENT ILLNESS: This is a 62 y.o. female complaining of right flank pain from an injury sustained 2 days ago.  Fell off the bed and hit thumb on the floor.  HPI   Prior to Admission medications   Medication Sig Start Date End Date Taking? Authorizing Provider  buPROPion (WELLBUTRIN XL) 150 MG 24 hr tablet TAKE 1 TABLET(150 MG) BY MOUTH DAILY 11/28/17  Yes McVey, Madelaine Bhat, PA-C  Multiple Vitamin (MULTIVITAMIN) capsule Take 1 capsule by mouth daily.   Yes [provider]    Allergies  Allergen Reactions  . Penicillins Rash    Patient Active Problem List   Diagnosis Date Noted  . UTI (lower urinary tract infection) 03/18/2012  . Vaginal discharge 03/18/2012  . Symptomatic menopausal or female climacteric states 03/18/2012  . Anxiety 03/18/2012    No past medical history on file.  No past surgical history on file.  Social History   Socioeconomic History  . Marital status: Divorced    Spouse name: Not on file  . Number of children: Not on file  . Years of education: Not on file  . Highest education level: Not on file  Occupational History  . Not on file  Social Needs  . Financial resource strain: Not on file  . Food insecurity:    Worry: Not on file    Inability: Not on file  . Transportation needs:    Medical: Not on file    Non-medical: Not on file  Tobacco Use  . Smoking status: Current Every Day Smoker    Packs/day: 1.00    Types: Cigarettes  . Smokeless tobacco: Never Used  Substance and Sexual Activity  . Alcohol use: No  . Drug use: No  . Sexual activity: Not on file  Lifestyle  . Physical activity:    Days per week: Not on file    Minutes per session: Not on file  . Stress: Not on file  Relationships  . Social connections:    Talks on phone: Not on file    Gets together: Not on file    Attends  religious service: Not on file    Active member of club or organization: Not on file    Attends meetings of clubs or organizations: Not on file    Relationship status: Not on file  . Intimate partner violence:    Fear of current or ex partner: Not on file    Emotionally abused: Not on file    Physically abused: Not on file    Forced sexual activity: Not on file  Other Topics Concern  . Not on file  Social History Narrative  . Not on file    No family history on file.   Review of Systems  Constitutional: Negative for chills and fever.  Respiratory: Negative for shortness of breath.   Cardiovascular: Negative for chest pain and palpitations.  Gastrointestinal: Negative for nausea and vomiting.  Skin: Negative for rash.  Neurological: Negative for dizziness and headaches.  Endo/Heme/Allergies: Negative.   All other systems reviewed and are negative.  Vitals:   02/27/18 1342  BP: (!) 170/76  Pulse: 100  Resp: 16  Temp: 98.7 F (37.1 C)  SpO2: 97%     Physical Exam  Constitutional: She is oriented to person, place, and time. She appears  well-developed and well-nourished.  HENT:  Head: Normocephalic.  Eyes: Pupils are equal, round, and reactive to light.  Neck: Normal range of motion. Neck supple.  Cardiovascular: Normal rate and regular rhythm.  Pulmonary/Chest: Effort normal and breath sounds normal.  Musculoskeletal:  Right thumb: Positive swelling with some tenderness.  Limited range of motion due to pain.  NVI. Right wrist: Within normal limits with full range of motion.  Neurological: She is alert and oriented to person, place, and time.  Skin: Skin is warm and dry. Capillary refill takes less than 2 seconds.  Psychiatric: She has a normal mood and affect. Her behavior is normal.  Vitals reviewed.  Dg Finger Thumb Right  Result Date: 02/27/2018 CLINICAL DATA:  Right thumb pain due to an unspecified injury. Initial encounter. EXAM: RIGHT THUMB 2+V COMPARISON:   None. FINDINGS: No acute bony or joint abnormality is identified. Mild degenerative change about the IP joint of the thumb is noted. Soft tissues are unremarkable. IMPRESSION: No acute or focal abnormality. Mild osteoarthritis IP joint right thumb. Electronically Signed   By: Kari Savage M.D.   On: 02/27/2018 14:34     ASSESSMENT & PLAN: [name] was seen today for hand injury.  Diagnoses and all orders for this visit:  Injury of right thumb, initial encounter -     DG Finger Thumb Right; Future  Sprain of right thumb, unspecified site of finger, initial encounter   Patient Instructions       IF you received an x-ray today, you will receive an invoice from [facility]. Please contact [facility] at [phone] with questions or concerns regarding your invoice.   IF you received labwork today, you will receive an invoice from [facility]. Please contact [facility] at [phone] with questions or concerns regarding your invoice.   Our billing staff will not be able to assist you with questions regarding bills from these companies.  You will be contacted with the lab results as soon as they are available. The fastest way to get your results is to activate your My Chart account. Instructions are located on the last page of this paperwork. If you have not heard from [place] regarding the results in 2 weeks, please contact this office.    Thumb Sprain A thumb sprain is an injury to one of the strong bands of tissue (ligaments) that connect the bones in your thumb. The ligament can be stretched too much or it can tear. A tear can be either partial or complete. The severity of the sprain depends on how much of the ligament was damaged or torn. What are the causes? A thumb sprain is often caused by a fall or an accident. If you extend your hands to catch an object or to protect yourself, the force of the impact can cause your ligament to stretch too much. This excess tension can  also cause your ligament to tear. What increases the risk? This injury is more likely to occur in people who play:  Sports that involve a greater risk of falling, such as skiing.  Sports that involve catching an object, such as basketball.  What are the signs or symptoms? Symptoms of this condition include:  Loss of motion in your thumb.  Bruising.  Tenderness.  Swelling.  How is this diagnosed? This condition is diagnosed with a medical history and physical exam. You may also have an X-ray of your thumb. How is this treated? Treatment varies depending on the severity of your sprain. If your ligament  is overstretched or partially torn, treatment usually involves keeping your thumb in a fixed position (immobilization) for a period of time. To help you do this, your health care provider will apply a bandage, cast, or splint to keep your thumb from moving until it heals. If your ligament is fully torn, you may need surgery to reconnect the ligament to the bone. After surgery, a cast or splint will be applied and will need to stay on your thumb while it heals. Your health care provider may also suggest exercises or physical therapy to strengthen your thumb. Follow these instructions at home: If you have a cast:  Do not stick anything inside the cast to scratch your skin. Doing that increases your risk of infection.  Check the skin around the cast every day. Report any concerns to your health care provider. You may put lotion on dry skin around the edges of the cast. Do not apply lotion to the skin underneath the cast.  Keep the cast clean and dry. If you have a splint:  Wear it as directed by your health care provider. Remove it only as directed by your health care provider.  Loosen the splint if your fingers become numb and tingle, or if they turn cold and blue.  Keep the splint clean and dry. Bathing  Cover the bandage, cast, or splint with a watertight plastic bag to protect  it from water while you take a bath or a shower. Do not let the bandage, cast, or splint get wet. Managing pain, stiffness, and swelling  If directed, apply ice to the injured area (unless you have a cast): ? Put ice in a plastic bag. ? Place a towel between your skin and the bag. ? Leave the ice on for 20 minutes, 2-3 times per day.  Move your fingers often to avoid stiffness and to lessen swelling.  Raise (elevate) the injured area above the level of your heart while you are sitting or lying down. Driving  Do not drive or operate heavy machinery while taking pain medicine.  Do not drive while wearing a cast or splint on a hand that you use for driving. General instructions  Do not put pressure on any part of your cast or splint until it is fully hardened. This may take several hours.  Take medicines only as directed by your health care provider. These include over-the-counter medicines and prescription medicines.  Keep all follow-up visits as directed by your health care provider. This is important.  Do any exercise or physical therapy as directed by your health care provider.  Do not wear rings on your injured thumb. Contact a health care provider if:  Your pain is not controlled with medicine.  Your bruising or swelling gets worse.  Your cast or splint is damaged. Get help right away if:  Your thumb is numb or blue.  Your thumb feels colder than normal. This information is not intended to replace advice given to you by your health care provider. Make sure you discuss any questions you have with your health care provider. Document Released: 12/21/2004 Document Revised: 07/16/2016 Document Reviewed: 08/25/2014 Elsevier Interactive Patient Education  2018 Elsevier Inc.      Edwina BarthMiguel Beanca Kiester, MD [facility]

## 2018-09-04 DIAGNOSIS — Z0271 Encounter for disability determination: Secondary | ICD-10-CM

## 2019-01-02 ENCOUNTER — Other Ambulatory Visit: Payer: Self-pay | Admitting: Physician Assistant

## 2019-01-02 DIAGNOSIS — Z8659 Personal history of other mental and behavioral disorders: Secondary | ICD-10-CM

## 2019-01-02 NOTE — Telephone Encounter (Signed)
Pt needs appt for wellbutrin XL refill; attempted to reach, unable to leave message.

## 2019-01-07 NOTE — Telephone Encounter (Signed)
Called and spoke with pt regarding the refill request of Wellbutrin. I advised pt of the need for an OV before anyone could do a refill. Pt advises that she lost her insurance and will call us back when she is ready to schedule an appt. I advised of the price range of $180-$230 for an OV. We do give a 55% discount and that will be due the DOS. I advised that any testing, bloodwork, urine sample, X-Ray, EKG etc, will be a separate bill and will be billed to them by mail. They will still receive a 55% discount on testing. Pt acknowledged.

## 2021-01-31 DIAGNOSIS — R9431 Abnormal electrocardiogram [ECG] [EKG]: Secondary | ICD-10-CM

## 2021-02-05 ENCOUNTER — Emergency Department (HOSPITAL_COMMUNITY): Payer: 59

## 2021-02-05 ENCOUNTER — Emergency Department (HOSPITAL_COMMUNITY)
Admission: EM | Admit: 2021-02-05 | Discharge: 2021-02-05 | Disposition: A | Discharge status: Home / Self Care | Payer: 59 | Attending: Emergency Medicine | Admitting: Emergency Medicine

## 2021-02-05 ENCOUNTER — Other Ambulatory Visit: Payer: Self-pay

## 2021-02-05 ENCOUNTER — Encounter (HOSPITAL_COMMUNITY): Payer: Self-pay | Admitting: Emergency Medicine

## 2021-02-05 DIAGNOSIS — S79912A Unspecified injury of left hip, initial encounter: Secondary | ICD-10-CM | POA: Diagnosis present

## 2021-02-05 DIAGNOSIS — F17210 Nicotine dependence, cigarettes, uncomplicated: Secondary | ICD-10-CM | POA: Insufficient documentation

## 2021-02-05 DIAGNOSIS — S7002XA Contusion of left hip, initial encounter: Secondary | ICD-10-CM | POA: Insufficient documentation

## 2021-02-05 DIAGNOSIS — W19XXXA Unspecified fall, initial encounter: Secondary | ICD-10-CM | POA: Diagnosis not present

## 2021-02-05 DIAGNOSIS — Y9261 Building [any] under construction as the place of occurrence of the external cause: Secondary | ICD-10-CM | POA: Diagnosis not present

## 2021-02-05 LAB — CBC WITH DIFFERENTIAL/PLATELET
Abs Immature Granulocytes: 0.04 10*3/uL (ref 0.00–0.07)
Basophils Absolute: 0.1 10*3/uL (ref 0.0–0.1)
Basophils Relative: 0 %
Eosinophils Absolute: 0.1 10*3/uL (ref 0.0–0.5)
Eosinophils Relative: 0 %
HCT: 47.4 % — ABNORMAL HIGH (ref 36.0–46.0)
Hemoglobin: 16.5 g/dL — ABNORMAL HIGH (ref 12.0–15.0)
Immature Granulocytes: 0 %
Lymphocytes Relative: 16 %
Lymphs Abs: 2.2 10*3/uL (ref 0.7–4.0)
MCH: 36.7 pg — ABNORMAL HIGH (ref 26.0–34.0)
MCHC: 34.8 g/dL (ref 30.0–36.0)
MCV: 105.3 fL — ABNORMAL HIGH (ref 80.0–100.0)
Monocytes Absolute: 0.6 10*3/uL (ref 0.1–1.0)
Monocytes Relative: 4 %
Neutro Abs: 10.8 10*3/uL — ABNORMAL HIGH (ref 1.7–7.7)
Neutrophils Relative %: 80 %
Platelets: 210 10*3/uL (ref 150–400)
RBC: 4.5 MIL/uL (ref 3.87–5.11)
RDW: 13.2 % (ref 11.5–15.5)
WBC: 13.8 10*3/uL — ABNORMAL HIGH (ref 4.0–10.5)
nRBC: 0 % (ref 0.0–0.2)

## 2021-02-05 LAB — BASIC METABOLIC PANEL
Anion gap: 13 (ref 5–15)
BUN: 5 mg/dL — ABNORMAL LOW (ref 8–23)
CO2: 25 mmol/L (ref 22–32)
Calcium: 9 mg/dL (ref 8.9–10.3)
Chloride: 102 mmol/L (ref 98–111)
Creatinine, Ser: 0.58 mg/dL (ref 0.44–1.00)
GFR, Estimated: >60 mL/min (ref >60)
Glucose, Bld: 131 mg/dL — ABNORMAL HIGH (ref 70–99)
Potassium: 4 mmol/L (ref 3.5–5.1)
Sodium: 140 mmol/L (ref 135–145)

## 2021-02-05 LAB — TYPE AND SCREEN
ABO/RH(D): O POS
Antibody Screen: NEGATIVE

## 2021-02-05 LAB — PROTIME-INR
INR: 1 (ref 0.8–1.2)
Prothrombin Time: 12.8 seconds (ref 11.4–15.2)

## 2021-02-05 MED ORDER — MORPHINE SULFATE (PF) 4 MG/ML IV SOLN
4.0000 mg | INTRAVENOUS | Status: DC | PRN
  Administered 2021-02-05: 4 mg via INTRAVENOUS
  Filled 2021-02-05: qty 1

## 2021-02-05 MED ORDER — MORPHINE SULFATE (PF) 4 MG/ML IV SOLN
4.0000 mg | Freq: Once | INTRAVENOUS | Status: AC
  Administered 2021-02-05: 4 mg via INTRAVENOUS
  Filled 2021-02-05: qty 1

## 2021-02-05 MED ORDER — ONDANSETRON HCL 4 MG/2ML IJ SOLN
4.0000 mg | Freq: Once | INTRAMUSCULAR | Status: AC
  Administered 2021-02-05: 4 mg via INTRAVENOUS
  Filled 2021-02-05: qty 2

## 2021-02-05 MED ORDER — KETOROLAC TROMETHAMINE 30 MG/ML IJ SOLN
30.0000 mg | Freq: Once | INTRAMUSCULAR | Status: AC
  Administered 2021-02-05: 30 mg via INTRAVENOUS
  Filled 2021-02-05: qty 1

## 2021-02-05 MED ORDER — MORPHINE SULFATE (PF) 4 MG/ML IV SOLN: 4.0000 mg | INTRAVENOUS | Status: DC | PRN

## 2021-02-05 NOTE — Discharge Instructions (Signed)
Please continue taking Ibuprofen as needed for pain. You can also apply OTC Voltaren gel to your muscles to help reduce pain.   Follow up with your PCP regarding your ED visit today  Return to the ED for any worsening symptoms

## 2021-02-05 NOTE — ED Triage Notes (Signed)
Pt arrives via EMS with reports of mechanical fall. Denies LOC. Endorses left hip pain. 22G right hand, 100 mcg fentanyl given by EMS.

## 2021-02-05 NOTE — ED Notes (Signed)
Pt unable to stand pt left leg buckled help pt back to bed

## 2021-02-05 NOTE — ED Notes (Signed)
Patient given discharge paperwork and instructions. Verbalized understanding of teaching. IV d/c with cath tip intact. Wheeled to exit in NAD.

## 2021-02-05 NOTE — ED Provider Notes (Signed)
Care assumed from [facility], [place], at shift change, please see their notes for full documentation of patient's complaint/HPI. Briefly, pt here with complaint of left hip pain s/p mechanical fall that occurred earlier this morning. No head injury or LOC. Results so far show negative xray of the hip as well as negative CT scan. Pt unable to ambulate in the ED due to significant amount of pain. Plan is to provide additional pain medication and have TOC evaluate patient for potential walker or other assistive device to go home with.   Physical Exam  BP 115/70 (BP Location: Left Arm)   Pulse 78   Temp 98.1 F (36.7 C) (Oral)   Resp 17   Ht 5\' 1"  (1.549 m)   Wt 56.7 kg   SpO2 95%   BMI 23.62 kg/m   Physical Exam Vitals and nursing note reviewed.  Constitutional:      Appearance: She is not ill-appearing.  HENT:     Head: Normocephalic and atraumatic.  Eyes:     Conjunctiva/sclera: Conjunctivae normal.  Cardiovascular:     Rate and Rhythm: Normal rate and regular rhythm.     Pulses: Normal pulses.  Pulmonary:     Effort: Pulmonary effort is normal.     Breath sounds: Normal breath sounds. No wheezing, rhonchi or rales.  Abdominal:     Palpations: Abdomen is soft.     Tenderness: There is no abdominal tenderness.  Musculoskeletal:     Cervical back: Neck supple.     Comments: No C, T, or L midline spinal TTP. No tenderness to paralumbar musculature. No external rotation or leg shortening of the LLE. No overlying skin changes to the left hip. + TTP to the lateral aspect of the left hip as well as posterior aspect of the left knee. ROM Intact to hip and knee. Pt is able to lift leg with hip flexion without difficulty. She is able to flex at the knee and ankle without difficulty. Strength 5/5 to entire LLE. Sensation intact. 2+ DP pulse.   Skin:    General: Skin is warm and dry.  Neurological:     Mental Status: She is alert.     ED Course/Procedures     Procedures  Results for  orders placed or performed during the hospital encounter of 02/05/21  Basic metabolic panel  Result Value Ref Range   Sodium 140 135 - 145 mmol/L   Potassium 4.0 3.5 - 5.1 mmol/L   Chloride 102 98 - 111 mmol/L   CO2 25 22 - 32 mmol/L   Glucose, Bld 131 (H) 70 - 99 mg/dL   BUN 5 (L) 8 - 23 mg/dL   Creatinine, Ser 04/07/21 0.44 - 1.00 mg/dL   Calcium 9.0 8.9 - 9.24 mg/dL   GFR, Estimated 26.8 >34 mL/min   Anion gap 13 5 - 15  CBC WITH DIFFERENTIAL  Result Value Ref Range   WBC 13.8 (H) 4.0 - 10.5 K/uL   RBC 4.50 3.87 - 5.11 MIL/uL   Hemoglobin 16.5 (H) 12.0 - 15.0 g/dL   HCT >19 (H) 62.2 - 29.7 %   MCV 105.3 (H) 80.0 - 100.0 fL   MCH 36.7 (H) 26.0 - 34.0 pg   MCHC 34.8 30.0 - 36.0 g/dL   RDW 98.9 21.1 - 94.1 %   Platelets 210 150 - 400 K/uL   nRBC 0.0 0.0 - 0.2 %   Neutrophils Relative % 80 %   Neutro Abs 10.8 (H) 1.7 - 7.7 K/uL  Lymphocytes Relative 16 %   Lymphs Abs 2.2 0.7 - 4.0 K/uL   Monocytes Relative 4 %   Monocytes Absolute 0.6 0.1 - 1.0 K/uL   Eosinophils Relative 0 %   Eosinophils Absolute 0.1 0.0 - 0.5 K/uL   Basophils Relative 0 %   Basophils Absolute 0.1 0.0 - 0.1 K/uL   Immature Granulocytes 0 %   Abs Immature Granulocytes 0.04 0.00 - 0.07 K/uL  Protime-INR  Result Value Ref Range   Prothrombin Time 12.8 11.4 - 15.2 seconds   INR 1.0 0.8 - 1.2  Type and screen [facility]  Result Value Ref Range   ABO/RH(D) O POS    Antibody Screen NEG    Sample Expiration      02/08/2021,2359 Performed at [facility], [address] [place], [place] [address]    DG Chest 1 View  Result Date: 02/05/2021 CLINICAL DATA:  Pain after fall EXAM: CHEST  1 VIEW COMPARISON:  None. FINDINGS: The heart size and mediastinal contours are within normal limits. Both lungs are clear. The visualized skeletal structures are unremarkable. IMPRESSION: No active disease. Electronically Signed   By: Gerome Sam III M.D   On: 02/05/2021 13:00   CT Hip Left Wo  Contrast  Result Date: 02/05/2021 CLINICAL DATA:  Larey Seat today.  Hip pain. EXAM: CT OF THE LEFT HIP WITHOUT CONTRAST TECHNIQUE: Multidetector CT imaging of the left hip was performed according to the standard protocol. Multiplanar CT image reconstructions were also generated. COMPARISON:  Radiographs, same date. FINDINGS: The left hip is normally located. No acute hip fracture is identified. The acetabulum is intact. The visualized left hemipelvis bony structures are intact. The pubic symphysis and left SI joint are intact. No obvious acute muscle injury or intramuscular hematoma. No significant intrapelvic abnormalities are identified. Scattered sigmoid colon diverticulosis is noted. Age advanced atherosclerotic calcifications involving the distal aorta and iliac arteries. IMPRESSION: 1. No acute hip fracture is identified. 2. Age advanced atherosclerotic calcifications involving the distal aorta and iliac arteries. Aortic Atherosclerosis (ICD10-I70.0). Electronically Signed   By: Rudie Meyer M.D.   On: 02/05/2021 14:20   DG Knee Complete 4 Views Left  Result Date: 02/05/2021 CLINICAL DATA:  Left knee pain after fall EXAM: LEFT KNEE - COMPLETE 4+ VIEW COMPARISON:  None. FINDINGS: No evidence of fracture, dislocation, or joint effusion. No evidence of arthropathy or other focal bone abnormality. Soft tissues are unremarkable. IMPRESSION: Negative. Electronically Signed   By: Duanne Guess D.O.   On: 02/05/2021 17:29   DG Hip Unilat With Pelvis 2-3 Views Left  Result Date: 02/05/2021 CLINICAL DATA:  Fall.  Pain. EXAM: DG HIP (WITH OR WITHOUT PELVIS) 2-3V LEFT COMPARISON:  None. FINDINGS: There is no evidence of hip fracture or dislocation. There is no evidence of arthropathy or other focal bone abnormality. IMPRESSION: Negative. Electronically Signed   By: Gerome Sam III M.D   On: 02/05/2021 12:83    MDM  65 year old female presents to the ED today with left hip pain status post mechanical fall,  has been unable to ambulate for [place] in the ED despite negative x-ray and negative CT scan.  She has been medicated with total of 8 mg morphine however continues to not be able to ambulate due to significant amount of pain. Will plan for PT to come evaluate patient. On my evaluation pt does have some TTP to the left lateral hip and posterior aspect of the knee. No ligamentous injury noted to  the knee however will plan for xray of knee to rule out fracture causing inability to ambulate.   PT has evaluated pt - reports that pt was able to stand up off the bed without assistance and walk a couple of steps before she began complaining of pain and needed to sit back down. He reports pt's exam does seem inconsistent at this time. It appears pt goes to [place] for most of her care and I am unable to see her visits in our records. PDMP reviewed without suspicious activity. Will plan to provide toradol at this time and get pt up to ambulate as long as knee xray is negative. Given she was able to ambulate with PT I do not feel she requires admission at this time.   Xray of knee negative.  Unfortunately TOC is no longer here to assist with wheelchair. Pt states she wants to go home and I do feel she does not require admission. I personally visualized pt ambulating with her walker to the bathroom without assistance.   This note was prepared using Dragon voice recognition software and may include unintentional dictation errors due to the inherent limitations of voice recognition software.    Tanda Rockers, PA-C 02/05/21 1901    Tilden Fossa, MD 02/05/21 2038

## 2021-02-05 NOTE — ED Provider Notes (Addendum)
[facility] EMERGENCY DEPARTMENT Provider Note   CSN: [id number] Arrival date & time: 02/05/21  1201     History Chief Complaint  Patient presents with  . Fall    Kari Savage is a 65 y.o. female.  Patient presents the emergency department today for evaluation of left-sided hip pain after a fall.  Patient states that she was in a building on her property trying to retrieve an item when she fell onto her left side.  She spent 1 to 2 hours trying to crawl back into her house to alert her husband.  She was out in the cold for a period of time.  She was unable to stand and bear weight.  She was unable to go up 1 stair due to pain in her hip.  She did not hit her head or lose consciousness.  She is not on any anticoagulation.  She denies pain in her chest, abdomen or pelvis.  EMS was called for transport to the hospital.  Patient received a total of 100 mcg of fentanyl prior to arrival. The onset of this condition was acute. The course is constant. Aggravating factors: movement. Alleviating factors: pain medication.          History reviewed. No pertinent past medical history.  Patient Active Problem List   Diagnosis Date Noted  . Injury of right thumb 02/27/2018  . Sprain of right thumb 02/27/2018  . UTI (lower urinary tract infection) 03/18/2012  . Vaginal discharge 03/18/2012  . Symptomatic menopausal or female climacteric states 03/18/2012  . Anxiety 03/18/2012    History reviewed. No pertinent surgical history.   OB History   No obstetric history on file.     No family history on file.  Social History   Tobacco Use  . Smoking status: Current Every Day Smoker    Packs/day: 1.00    Types: Cigarettes  . Smokeless tobacco: Never Used  Substance Use Topics  . Alcohol use: No  . Drug use: No    Home Medications Prior to Admission medications   Medication Sig Start Date End Date Taking? Authorizing Provider  buPROPion (WELLBUTRIN XL) 150 MG 24  hr tablet TAKE 1 TABLET(150 MG) BY MOUTH DAILY 11/28/17   McVey, Madelaine Bhat, PA-C  Multiple Vitamin (MULTIVITAMIN) capsule Take 1 capsule by mouth daily.    [provider]    Allergies    Penicillins  Review of Systems   Review of Systems  Constitutional: Negative for fever.  HENT: Negative for rhinorrhea and sore throat.   Eyes: Negative for redness.  Respiratory: Negative for cough.   Cardiovascular: Negative for chest pain.  Gastrointestinal: Negative for abdominal pain, diarrhea, nausea and vomiting.  Genitourinary: Negative for dysuria, frequency, hematuria and urgency.  Musculoskeletal: Positive for gait problem, joint swelling and myalgias. Negative for neck pain.  Skin: Negative for rash.  Neurological: Negative for headaches.    Physical Exam Updated Vital Signs BP (!) 146/49 (BP Location: Left Arm)   Pulse 64   Temp 98.1 F (36.7 C) (Oral)   Resp 18   Ht 5\' 1"  (1.549 m)   Wt 56.7 kg   SpO2 98%   BMI 23.62 kg/m   Physical Exam Vitals and nursing note reviewed.  Constitutional:      General: She is in acute distress (Uncomfortable appearing).     Appearance: She is well-developed.  HENT:     Head: Normocephalic and atraumatic.     Right Ear: External ear normal.  Left Ear: External ear normal.     Nose: Nose normal.  Eyes:     Conjunctiva/sclera: Conjunctivae normal.  Cardiovascular:     Rate and Rhythm: Normal rate and regular rhythm.     Pulses:          Dorsalis pedis pulses are 2+ on the right side and 2+ on the left side.     Heart sounds: No murmur heard.   Pulmonary:     Effort: No respiratory distress.     Breath sounds: No wheezing, rhonchi or rales.  Abdominal:     Palpations: Abdomen is soft.     Tenderness: There is no abdominal tenderness. There is no guarding or rebound.  Musculoskeletal:        General: No swelling or deformity.     Cervical back: Normal range of motion and neck supple.     Left hip: Tenderness  present. Decreased range of motion.     Left knee: No tenderness.     Right lower leg: No edema.     Left lower leg: No edema.     Left ankle: Normal. No tenderness. Normal range of motion.  Skin:    General: Skin is warm and dry.     Findings: No rash.  Neurological:     General: No focal deficit present.     Mental Status: She is alert. Mental status is at baseline.     Motor: No weakness.  Psychiatric:        Mood and Affect: Mood normal.     ED Results / Procedures / Treatments   Labs (all labs ordered are listed, but only abnormal results are displayed) Labs Reviewed  BASIC METABOLIC PANEL - Abnormal; Notable for the following components:      Result Value   Glucose, Bld 131 (*)    BUN 5 (*)    All other components within normal limits  CBC WITH DIFFERENTIAL/PLATELET - Abnormal; Notable for the following components:   WBC 13.8 (*)    Hemoglobin 16.5 (*)    HCT 47.4 (*)    MCV 105.3 (*)    MCH 36.7 (*)    Neutro Abs 10.8 (*)    All other components within normal limits  PROTIME-INR  TYPE AND SCREEN  ABO/RH    ED ECG REPORT   Date: 02/05/2021  Rate: 61  Rhythm: normal sinus rhythm  QRS Axis: right  Intervals: normal  ST/T Wave abnormalities: nonspecific ST/T changes  Conduction Disutrbances:none  Narrative Interpretation:   Old EKG Reviewed: none available  I have personally reviewed the EKG tracing and agree with the computerized printout as noted.  Radiology DG Chest 1 View  Result Date: 02/05/2021 CLINICAL DATA:  Pain after fall EXAM: CHEST  1 VIEW COMPARISON:  None. FINDINGS: The heart size and mediastinal contours are within normal limits. Both lungs are clear. The visualized skeletal structures are unremarkable. IMPRESSION: No active disease. Electronically Signed   By: Gerome Sam III M.D   On: 02/05/2021 13:00   DG Hip Unilat With Pelvis 2-3 Views Left  Result Date: 02/05/2021 CLINICAL DATA:  Fall.  Pain. EXAM: DG HIP (WITH OR WITHOUT PELVIS)  2-3V LEFT COMPARISON:  None. FINDINGS: There is no evidence of hip fracture or dislocation. There is no evidence of arthropathy or other focal bone abnormality. IMPRESSION: Negative. Electronically Signed   By: Gerome Sam III M.D   On: 02/05/2021 12:58    Procedures Procedures   Medications Ordered in ED  Medications  morphine 4 MG/ML injection 4 mg (4 mg Intravenous Given 02/05/21 1238)  ondansetron (ZOFRAN) injection 4 mg (4 mg Intravenous Given 02/05/21 1238)  morphine 4 MG/ML injection 4 mg (4 mg Intravenous Given 02/05/21 1436)    ED Course  I have reviewed the triage vital signs and the nursing notes.  Pertinent labs & imaging results that were available during my care of the patient were reviewed by me and considered in my medical decision making (see chart for details).  Patient seen and examined. Work-up initiated. Medications ordered.   Vital signs reviewed and are as follows: BP (!) 146/49 (BP Location: Left Arm)   Pulse 64   Temp 98.1 F (36.7 C) (Oral)   Resp 18   Ht 5\' 1"  (1.549 m)   Wt 56.7 kg   SpO2 98%   BMI 23.62 kg/m   1:08 PM x-rays reviewed.  Read by radiology is negative.  Patient with better range of motion of her left leg and hip now that her pain is better controlled.  Will attempt ambulation.  1:41 PM patient was unable to bear weight or ambulate.  CT of the left hip ordered.  Additional pain medication ordered.  3:08 PM CT of the hip was negative for injury.  Additional pain medication given.  Another attempt was made to ambulate the patient and she screams out in pain.   TOC consult ordered.  Signout to at shift change. No indication for admission at this time.  Plan: TOC consult, continue pain control. If patient improves and can perform ADLs, can be discharge. TOC consult to help with possibility of obtaining DME or home health.     MDM Rules/Calculators/A&P                          Pending TOC consult and symptom control.     Final Clinical Impression(s) / ED Diagnoses Final diagnoses:  Contusion of left hip, initial encounter  Fall, initial encounter    Rx / DC Orders ED Discharge Orders    None         12-23-1969, PA-C 02/05/21 1513    04/07/21, MD 02/05/21 1622

## 2021-02-05 NOTE — ED Notes (Signed)
Patient transported to X-ray 

## 2021-02-05 NOTE — Care Management (Signed)
ED RNCM received consult for DME,  Patient s/p mechanical fall. Patient will need a rolling walker, for discharge. RNCM contacted Arnold Long with [employer] walker will be delivered to room prior to discharge.

## 2021-02-05 NOTE — ED Notes (Signed)
Pt unable to bare weight on left leg. Pt stood up and said she can't walk she is in to much pain left leg buckled pt put back to bed.

## 2021-02-05 NOTE — ED Notes (Signed)
Pt transported to CT scan.

## 2021-02-05 NOTE — Evaluation (Signed)
Physical Therapy Evaluation Patient Details Name: Kari Savage MRN: [id number] DOB: 02/12/1956 Today's Date: 02/05/2021   History of Present Illness  65 y.o. female presenting to [facility] ED on 02/05/2021 s/p fall. Medical history includes anxiety, UTI.  Clinical Impression  Pt presents to PT s/p fall with significant L hip and LE pain. Pt's pain does not present with consistent movements and seems variable, occurring at times with hip flexion and others with hip extension, then at times with weightbearing and other times when offloaded in SLS on RLE. Pt demonstrates full ROM of LLE and is able to take 4 consecutive steps on RLE at the edge of bed with UE support of walker. Pt demonstrates sufficient mobility to perform stand pivot transfers to wheelchair, and has sufficient UE mobility and strength to propel a wheelchair household distances. PT recommends the patient discharge home with HHPT and a manual wheelchair. Acute PT will follow during this hospitalization.  Patient suffers from L hip pain which impairs their ability to perform daily activities like walking in the home.  A walker alone will not resolve the issues with performing activities of daily living. A wheelchair will allow patient to safely perform daily activities.  The patient can self propel in the home or has a caregiver who can provide assistance.        Follow Up Recommendations Home health PT;Supervision for mobility/OOB    Equipment Recommendations  Wheelchair (measurements PT)    Recommendations for Other Services       Precautions / Restrictions Precautions Precautions: Fall Restrictions Weight Bearing Restrictions: No      Mobility  Bed Mobility Overal bed mobility: Needs Assistance Bed Mobility: Supine to Sit;Sit to Supine     Supine to sit: Supervision Sit to supine: Supervision        Transfers Overall transfer level: Needs assistance Equipment used: 1 person hand held assist;Rolling walker (2  wheeled) Transfers: Sit to/from Stand Sit to Stand: Min guard            Ambulation/Gait Ambulation/Gait assistance: Min guard Gait Distance (Feet): 2 Feet Assistive device: Rolling walker (2 wheeled) Gait Pattern/deviations: Step-to pattern;Decreased weight shift to left Gait velocity: reduced Gait velocity interpretation: <1.31 ft/sec, indicative of household ambulator General Gait Details: pt able to perofrm SLS on LLE with UE support of RW, pt performs 4 consecutive marching steps in place without note of L knee buckling. Pt takes 2 steps forward and then refuses to ambulate further  Stairs            Wheelchair Mobility    Modified Rankin (Stroke Patients Only)       Balance Overall balance assessment: Needs assistance Sitting-balance support: No upper extremity supported;Feet supported Sitting balance-Leahy Scale: Good   Postural control: Right lateral lean (to take weight off L hip) Standing balance support: Single extremity supported;Bilateral upper extremity supported Standing balance-Leahy Scale: Poor Standing balance comment: reliant on UE support of RW                             Pertinent Vitals/Pain Pain Assessment: Faces Faces Pain Scale: Hurts whole lot Pain Location: L hip radiating down past L knee Pain Descriptors / Indicators: Stabbing Pain Intervention(s): Monitored during session    Home Living Family/patient expects to be discharged to:: Private residence Living Arrangements: Spouse/significant other Available Help at Discharge: Family;Available 24 hours/day Type of Home: Mobile home Home Access: Stairs to enter Entrance Stairs-Rails: None  Entrance Stairs-Number of Steps: 1 Home Layout: One level Home Equipment: Walker - 2 wheels;Cane - single point      Prior Function Level of Independence: Independent         Comments: working     Higher education careers adviser        Extremity/Trunk Assessment   Upper Extremity  Assessment Upper Extremity Assessment: Overall WFL for tasks assessed    Lower Extremity Assessment Lower Extremity Assessment: LLE deficits/detail LLE Deficits / Details: AROM WFL, pt with at least 3/5 strength based on observed motion, strength assessment deferred due to pt reports of pain    Cervical / Trunk Assessment Cervical / Trunk Assessment: Normal  Communication   Communication: No difficulties  Cognition Arousal/Alertness: Awake/alert Behavior During Therapy: WFL for tasks assessed/performed Overall Cognitive Status: Within Functional Limits for tasks assessed                                        General Comments General comments (skin integrity, edema, etc.): VSS on RA    Exercises     Assessment/Plan    PT Assessment Patient needs continued PT services  PT Problem List Decreased strength;Decreased activity tolerance;Decreased balance;Decreased mobility;Pain       PT Treatment Interventions DME instruction;Gait training;Stair training;Functional mobility training;Therapeutic activities;Balance training;Therapeutic exercise;Patient/family education;Wheelchair mobility training    PT Goals (Current goals can be found in the Care Plan section)  Acute Rehab PT Goals Patient Stated Goal: to reduce LLE pain PT Goal Formulation: With patient Time For Goal Achievement: 02/19/21 Potential to Achieve Goals: Good    Frequency Min 3X/week   Barriers to discharge        Co-evaluation               AM-PAC PT "6 Clicks" Mobility  Outcome Measure Help needed turning from your back to your side while in a flat bed without using bedrails?: A Little Help needed moving from lying on your back to sitting on the side of a flat bed without using bedrails?: A Little Help needed moving to and from a bed to a chair (including a wheelchair)?: A Little Help needed standing up from a chair using your arms (e.g., wheelchair or bedside chair)?: A  Little Help needed to walk in hospital room?: A Lot Help needed climbing 3-5 steps with a railing? : A Lot 6 Click Score: 16    End of Session   Activity Tolerance: Patient limited by pain Patient left: in bed;with call bell/phone within reach Nurse Communication: Mobility status PT Visit Diagnosis: Unsteadiness on feet (R26.81);Other abnormalities of gait and mobility (R26.89);Muscle weakness (generalized) (M62.81);Pain Pain - Right/Left: Left Pain - part of body: Hip;Leg    Time: 1551-1610 PT Time Calculation (min) (ACUTE ONLY): 19 min   Charges:   PT Evaluation $PT Eval Low Complexity: 1 Low          Arlyss Gandy, PT, DPT Acute Rehabilitation Pager: [phone]   Arlyss Gandy 02/05/2021, 5:11 PM

## 2022-03-30 IMAGING — DX DG CHEST 1V
1 series · 1 of 1 positions shown · non-contrast
Comparison: None.

CLINICAL DATA: Pain after fall

EXAM:
CHEST  1 VIEW

[chest ap]
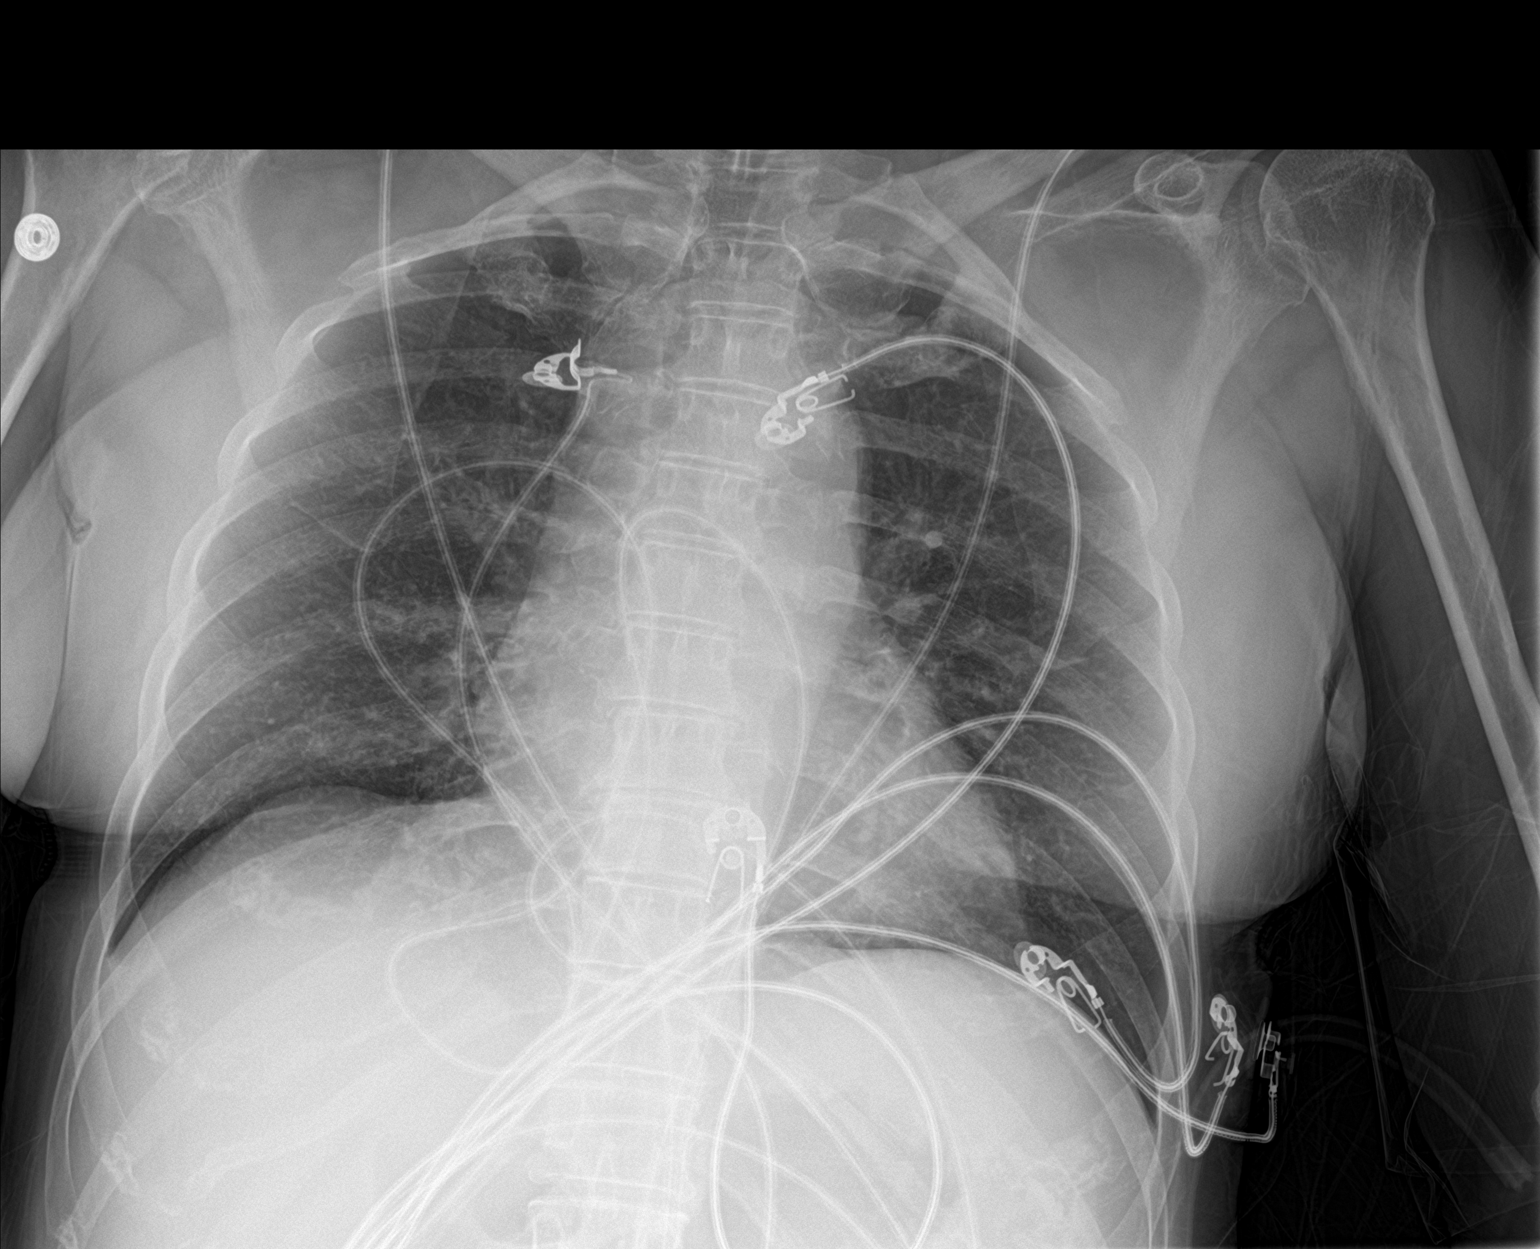

[1 of 1 positions shown; findings below may reference images not displayed]

FINDINGS: The heart size and mediastinal contours are within normal limits.
Both lungs are clear. The visualized skeletal structures are
unremarkable.
IMPRESSION: No active disease.
